# Patient Record
Sex: Female | Born: 1935 | Marital: Single
Health system: Southern US, Community
[De-identification: ages and names within clinical notes are randomized; demographics above are authoritative.]

## PROBLEM LIST (undated history)

## (undated) DIAGNOSIS — E039 Hypothyroidism, unspecified: Secondary | ICD-10-CM

---

## 1998-07-19 ENCOUNTER — Other Ambulatory Visit: Admission: RE | Admit: 1998-07-19 | Discharge: 1998-07-19 | Payer: Self-pay | Admitting: Family Medicine

## 2000-07-24 ENCOUNTER — Other Ambulatory Visit: Admission: RE | Admit: 2000-07-24 | Discharge: 2000-07-24 | Payer: Self-pay | Admitting: Family Medicine

## 2011-10-08 ENCOUNTER — Encounter (HOSPITAL_COMMUNITY): Payer: Self-pay | Admitting: Emergency Medicine

## 2011-10-08 ENCOUNTER — Emergency Department (HOSPITAL_COMMUNITY): Payer: Medicare Other

## 2011-10-08 ENCOUNTER — Emergency Department (HOSPITAL_COMMUNITY)
Admission: EM | Admit: 2011-10-08 | Discharge: 2011-10-08 | Disposition: A | Discharge status: Home / Self Care | Payer: Medicare Other | Attending: Emergency Medicine | Admitting: Emergency Medicine

## 2011-10-08 DIAGNOSIS — Z79899 Other long term (current) drug therapy: Secondary | ICD-10-CM | POA: Insufficient documentation

## 2011-10-08 DIAGNOSIS — N390 Urinary tract infection, site not specified: Secondary | ICD-10-CM | POA: Insufficient documentation

## 2011-10-08 DIAGNOSIS — R5381 Other malaise: Secondary | ICD-10-CM | POA: Insufficient documentation

## 2011-10-08 DIAGNOSIS — E039 Hypothyroidism, unspecified: Secondary | ICD-10-CM | POA: Insufficient documentation

## 2011-10-08 DIAGNOSIS — G459 Transient cerebral ischemic attack, unspecified: Secondary | ICD-10-CM | POA: Insufficient documentation

## 2011-10-08 DIAGNOSIS — Z7982 Long term (current) use of aspirin: Secondary | ICD-10-CM | POA: Insufficient documentation

## 2011-10-08 DIAGNOSIS — R4789 Other speech disturbances: Secondary | ICD-10-CM | POA: Insufficient documentation

## 2011-10-08 HISTORY — DX: Hypothyroidism, unspecified: E03.9

## 2011-10-08 LAB — BASIC METABOLIC PANEL
BUN: 21 mg/dL (ref 6–23)
Calcium: 9.4 mg/dL (ref 8.4–10.5)
GFR calc Af Amer: 63 mL/min — ABNORMAL LOW (ref >90)
GFR calc non Af Amer: 54 mL/min — ABNORMAL LOW (ref >90)
Glucose, Bld: 111 mg/dL — ABNORMAL HIGH (ref 70–99)
Potassium: 4.1 mEq/L (ref 3.5–5.1)
Sodium: 139 mEq/L (ref 135–145)

## 2011-10-08 LAB — URINALYSIS, ROUTINE W REFLEX MICROSCOPIC
Nitrite: NEGATIVE
Protein, ur: NEGATIVE mg/dL
Specific Gravity, Urine: 1.028 (ref 1.005–1.030)
Urobilinogen, UA: 0.2 mg/dL (ref 0.0–1.0)

## 2011-10-08 LAB — CBC WITH DIFFERENTIAL/PLATELET
Basophils Relative: 0 % (ref 0–1)
Eosinophils Absolute: 0.2 10*3/uL (ref 0.0–0.7)
Eosinophils Relative: 2 % (ref 0–5)
Lymphs Abs: 5 10*3/uL — ABNORMAL HIGH (ref 0.7–4.0)
MCH: 30.8 pg (ref 26.0–34.0)
MCHC: 32.8 g/dL (ref 30.0–36.0)
MCV: 93.9 fL (ref 78.0–100.0)
Neutrophils Relative %: 47 % (ref 43–77)
Platelets: 204 10*3/uL (ref 150–400)

## 2011-10-08 MED ORDER — CIPROFLOXACIN HCL 500 MG PO TABS: 500.0000 mg | ORAL_TABLET | Freq: Two times a day (BID) | ORAL | Status: AC

## 2011-10-08 MED ORDER — CIPROFLOXACIN HCL 500 MG PO TABS
500.0000 mg | ORAL_TABLET | Freq: Once | ORAL | Status: AC
  Administered 2011-10-08: 500 mg via ORAL
  Filled 2011-10-08: qty 1

## 2011-10-08 NOTE — ED Provider Notes (Addendum)
History     CSN: [id number]  Arrival date & time 10/08/11  4098   First MD Initiated Contact with Patient 10/08/11 1942      Chief Complaint  Patient presents with  . Transient Ischemic Attack    (Consider location/radiation/quality/duration/timing/severity/associated sxs/prior treatment) Patient is a 78 y.o. female presenting with weakness. The history is provided by the EMS personnel and a relative.  Weakness The primary symptoms include speech change. Primary symptoms comment: family states pt slumped over with slurred speech and right sided facial droop The symptoms began less than 1 hour ago. The episode lasted 5 minutes. The symptoms are resolved. The neurological symptoms are focal. Context: while sitting with family.  Change in speech began less than 1 hour ago. Progression of speech change: resolved. Features of the speech change include inability to articulate.  Additional symptoms include weakness. Additional symptoms do not include pain. Medical issues do not include seizures or cerebral vascular accident. Workup history does not include MRI or CT scan.    Past Medical History  Diagnosis Date  . Hypothyroidism     No past surgical history on file.  No family history on file.  History  Substance Use Topics  . Smoking status: Not on file  . Smokeless tobacco: Not on file  . Alcohol Use:     OB History    Grav Para Term Preterm Abortions TAB SAB Ect Mult Living                  Review of Systems  Neurological: Positive for speech change and weakness.  All other systems reviewed and are negative.    Allergies  Review of patient's allergies indicates no known allergies.  Home Medications   Current Outpatient Rx  Name Route Sig Dispense Refill  . ASPIRIN 325 MG PO TABS Oral Take by mouth daily.    Marland Kitchen LEVOTHYROXINE SODIUM 100 MCG PO TABS Oral Take by mouth daily.    Marland Kitchen RISPERIDONE 0.25 MG PO TABS Oral Take by mouth.      BP 163/59  Pulse 73  Temp 97.6  F (36.4 C) (Oral)  Resp 18  SpO2 96%  Physical Exam  Nursing note and vitals reviewed. Constitutional: She is oriented to person, place, and time. She appears well-developed and well-nourished. No distress.  HENT:  Head: Normocephalic and atraumatic.  Mouth/Throat: Oropharynx is clear and moist.  Eyes: Conjunctivae and EOM are normal. Pupils are equal, round, and reactive to light.  Neck: Normal range of motion. Neck supple.  Cardiovascular: Normal rate, regular rhythm and intact distal pulses.   No murmur heard. Pulmonary/Chest: Effort normal and breath sounds normal. No respiratory distress. She has no wheezes. She has no rales.  Abdominal: Soft. She exhibits no distension. There is no tenderness. There is no rebound and no guarding.  Musculoskeletal: Normal range of motion. She exhibits no edema and no tenderness.  Neurological: She is alert and oriented to person, place, and time. She has normal strength. No cranial nerve deficit or sensory deficit. Coordination and gait normal. GCS eye subscore is 4. GCS verbal subscore is 5. GCS motor subscore is 6.       Normal word articulation and repeating.  Normal finger to nose and normal visual fields  Skin: Skin is warm and dry. No rash noted. No erythema.  Psychiatric: She has a normal mood and affect. Her behavior is normal.    ED Course  Procedures (including critical care time)  Labs Reviewed  CBC  WITH DIFFERENTIAL - Abnormal; Notable for the following:    WBC 11.7 (*)     Lymphs Abs 5.0 (*)     All other components within normal limits  BASIC METABOLIC PANEL - Abnormal; Notable for the following:    Glucose, Bld 111 (*)     GFR calc non Af Amer 54 (*)     GFR calc Af Amer 63 (*)     All other components within normal limits  URINALYSIS, ROUTINE W REFLEX MICROSCOPIC - Abnormal; Notable for the following:    APPearance CLOUDY (*)     Hgb urine dipstick TRACE (*)     Bilirubin Urine SMALL (*)     Leukocytes, UA MODERATE (*)      All other components within normal limits  URINE MICROSCOPIC-ADD ON  POCT I-STAT TROPONIN I   Dg Chest 2 View  10/08/2011  *RADIOLOGY REPORT*  Clinical Data: Transient ischemic attack.  Cigarette smoker.  CHEST - 2 VIEW  Comparison: None.  Findings: Diffuse interstitial prominence is present.  Basilar atelectasis.  Right midlung subsegmental atelectasis or scarring. Cardiopericardial silhouette appears within normal limits allowing for slightly low lung volumes.  No airspace disease or effusion. Mediastinal contours are within normal limits.  Exaggerated thoracic kyphosis.  There appears to be an L1 compression fracture which is age indeterminant.  Osteopenia is present diffusely. Lucency is present on the lateral view inferior to the pulmonary hila, probably representing a bulla.  IMPRESSION: Low volume chest with basilar atelectasis.  Right midlung scarring or atelectasis.  No acute cardiopulmonary disease.  Original Report Authenticated By: Andreas Newport, M.D.   Ct Head Wo Contrast  10/08/2011  *RADIOLOGY REPORT*  Clinical Data: Slurred speech.  Right facial droop.  CT HEAD WITHOUT CONTRAST  Technique:  Contiguous axial images were obtained from the base of the skull through the vertex without contrast.  Comparison: MRI 07/01/2007  Findings: The brain shows mild generalized atrophy.  There are mild chronic small vessel changes affecting the hemispheric deep white matter.  No evidence of acute infarction, mass lesion, hemorrhage, hydrocephalus or extra-axial collection.  The calvarium is unremarkable.  Sinuses, middle ears and mastoids are clear.  IMPRESSION: Mild age related atrophy and chronic small vessel change.  No identifiably acute insult.  Original Report Authenticated By: Thomasenia Sales, M.D.     Date: 10/08/2011  Rate: 73  Rhythm: normal sinus rhythm  QRS Axis: normal  Intervals: normal  ST/T Wave abnormalities: nonspecific ST/T changes  Conduction Disutrbances:right bundle branch  block  Narrative Interpretation:   Old EKG Reviewed: none available   1. UTI (lower urinary tract infection)   2. TIA (transient ischemic attack)       MDM   Patient with symptoms that sound most like a TIA. Family states she slumped over had right-sided facial droop and slurred speech for about 5 minutes which resolved. Here patient states she just wants to go home and she feels fine. She has a history of hypothyroidism but no prior history of stroke. She does take aspirin 325 daily she has no deficits on exam. TIA workup started. We'll wait for family to arrive for further history as patient is unable to provide any history.  9:30 PM Patient with evidence of a severe urinary tract infection. Head CT and the rest of the labs are within normal limits. Discussed with family about a TIA workup in the hospital and the patient adamantly refuses to stay in the hospital so they will followup with  her doctor in [place] for further workup. Patient started on Cipro      Gwyneth Sprout, MD 10/08/11 2131  Gwyneth Sprout, MD 10/08/11 [phone]

## 2011-10-08 NOTE — ED Notes (Signed)
CBG 151 with EMS

## 2011-10-08 NOTE — ED Notes (Signed)
PER EMS- Family states patient slumped over in chair had slurred speech, and right sided facial droop for about 5 minutes. Symptoms subsided by the time EMS arrived. During transport patient had short episode of slurred speech and frontal headache. Alertx4, NAD. Patient ambulatory on scene.

## 2011-10-10 LAB — URINE CULTURE: Colony Count: 4000

## 2013-05-04 DEATH — deceased

## 2013-05-12 ENCOUNTER — Telehealth: Payer: Self-pay

## 2013-05-12 NOTE — Telephone Encounter (Signed)
Patient past away @ [facility] in [place] per Ileene Hutchinsonbituary in [place] [employer]

## 2014-01-02 IMAGING — CR DG CHEST 2V
2 series · 2 of 2 positions shown · non-contrast
Comparison: None.

CLINICAL DATA: Transient ischemic attack.  Cigarette smoker.

CHEST - 2 VIEW

[w chest lat]
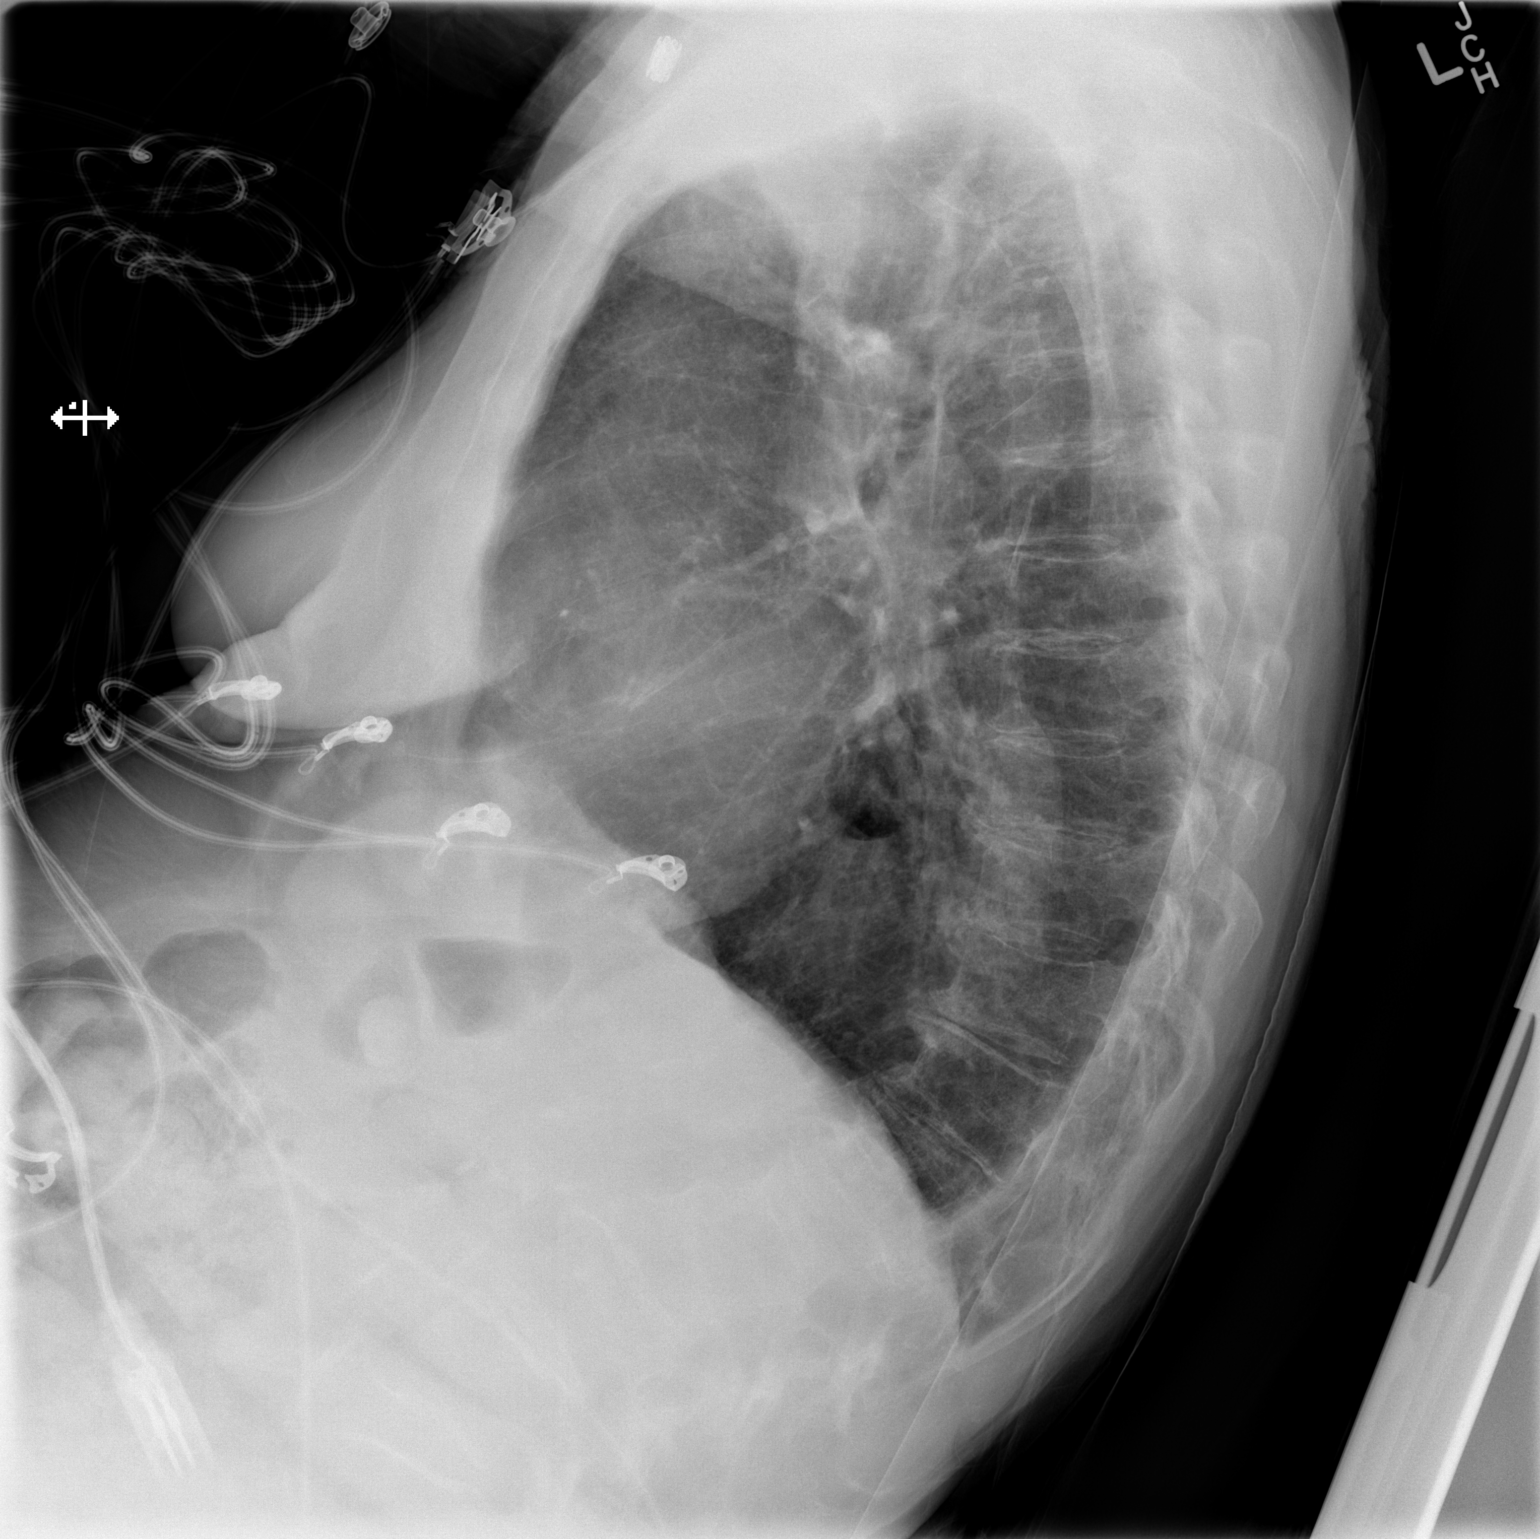

[x chest ap]
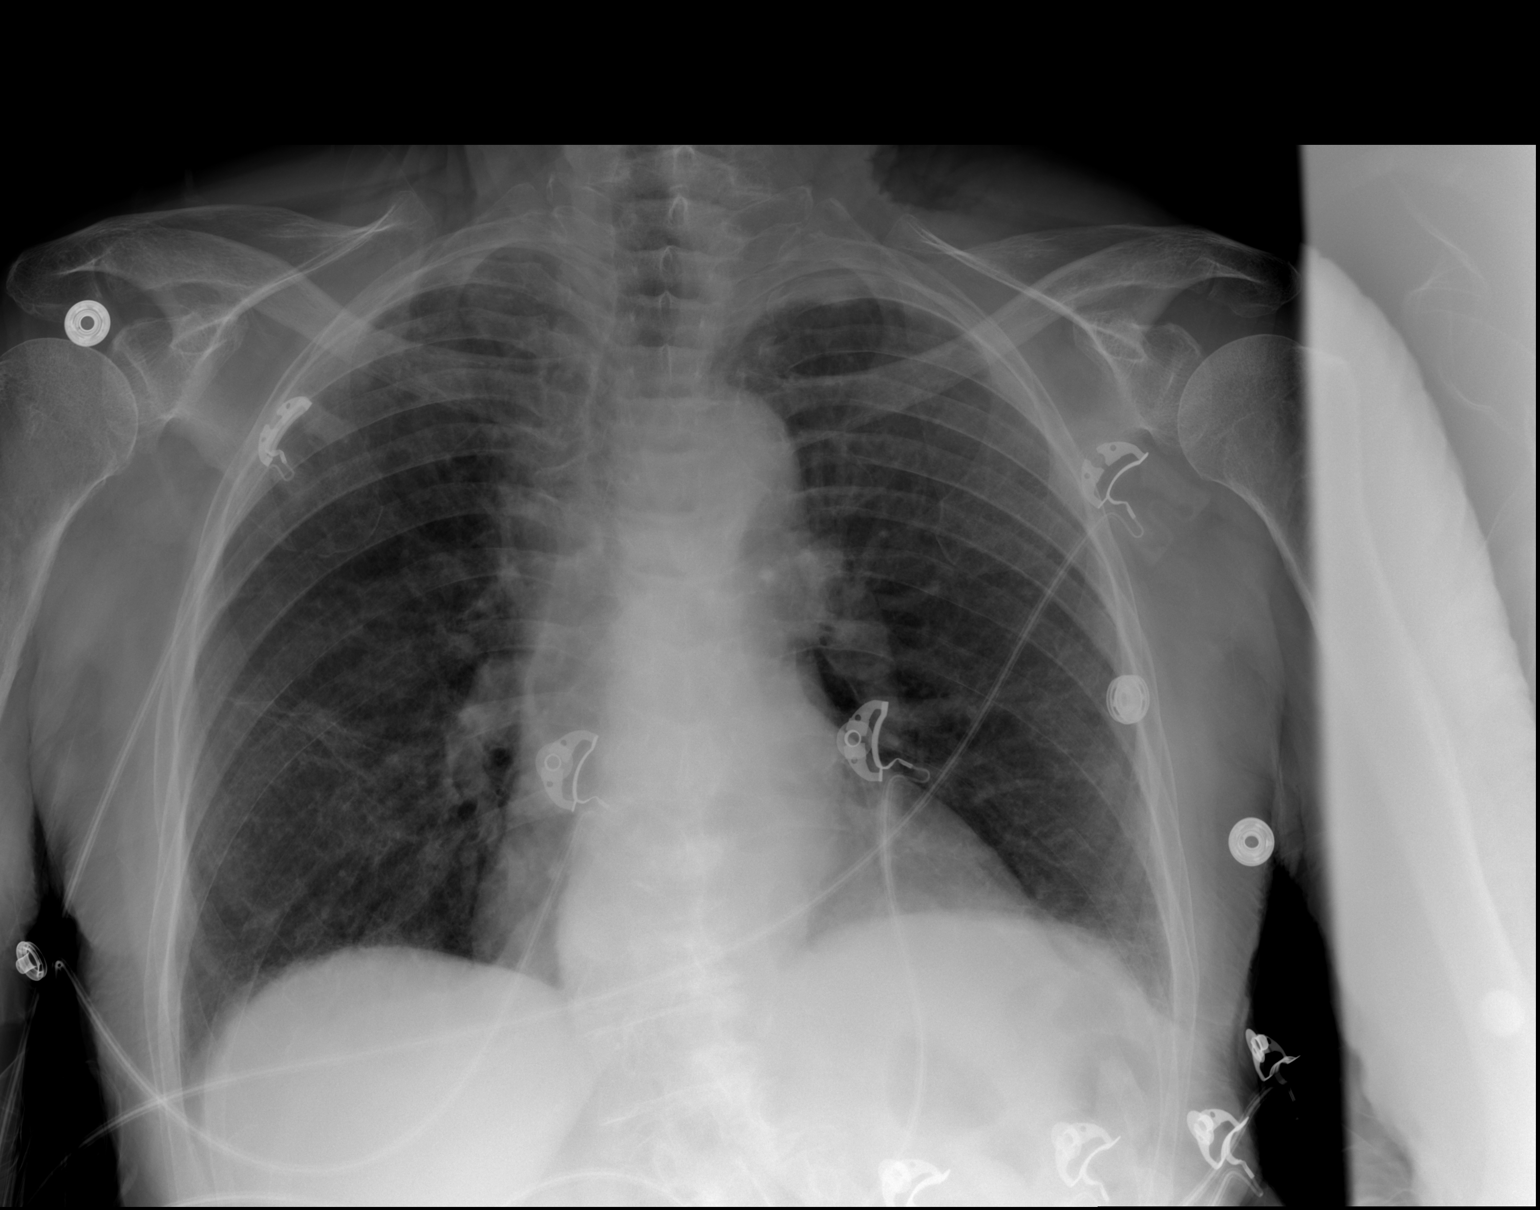

[2 of 2 positions shown; findings below may reference images not displayed]

FINDINGS: Diffuse interstitial prominence is present.  Basilar
atelectasis.  Right midlung subsegmental atelectasis or scarring.
Cardiopericardial silhouette appears within normal limits allowing
for slightly low lung volumes.  No airspace disease or effusion.
Mediastinal contours are within normal limits.  Exaggerated
thoracic kyphosis.  There appears to be an L1 compression fracture
which is age indeterminant.  Osteopenia is present diffusely.
Lucency is present on the lateral view inferior to the pulmonary
hila, probably representing a bulla.
IMPRESSION: Low volume chest with basilar atelectasis.  Right midlung scarring
or atelectasis.  No acute cardiopulmonary disease.
